# Patient Record
Sex: Female | Born: 2000 | Race: White | Hispanic: No | Marital: Single | Smoking: Never smoker
Health system: Southern US, Community
[De-identification: ages and names within clinical notes are randomized; demographics above are authoritative.]

## PROBLEM LIST (undated history)

## (undated) HISTORY — PX: EYE SURGERY: SHX253

---
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
## 2021-04-28 ENCOUNTER — Other Ambulatory Visit: Payer: Self-pay

## 2021-04-28 ENCOUNTER — Encounter (HOSPITAL_BASED_OUTPATIENT_CLINIC_OR_DEPARTMENT_OTHER): Payer: Self-pay | Admitting: Emergency Medicine

## 2021-04-28 ENCOUNTER — Emergency Department (HOSPITAL_BASED_OUTPATIENT_CLINIC_OR_DEPARTMENT_OTHER)
Admission: EM | Admit: 2021-04-28 | Discharge: 2021-04-29 | Disposition: A | Discharge status: Home / Self Care | Payer: BC Managed Care – PPO | Attending: Emergency Medicine | Admitting: Emergency Medicine

## 2021-04-28 DIAGNOSIS — K5900 Constipation, unspecified: Secondary | ICD-10-CM | POA: Insufficient documentation

## 2021-04-28 DIAGNOSIS — R102 Pelvic and perineal pain: Secondary | ICD-10-CM | POA: Diagnosis not present

## 2021-04-28 DIAGNOSIS — R339 Retention of urine, unspecified: Secondary | ICD-10-CM | POA: Diagnosis present

## 2021-04-28 DIAGNOSIS — R338 Other retention of urine: Secondary | ICD-10-CM

## 2021-04-28 LAB — URINALYSIS, ROUTINE W REFLEX MICROSCOPIC
Bilirubin Urine: NEGATIVE
Glucose, UA: NEGATIVE mg/dL
Hgb urine dipstick: NEGATIVE
Ketones, ur: NEGATIVE mg/dL
Leukocytes,Ua: NEGATIVE
Nitrite: NEGATIVE
Protein, ur: NEGATIVE mg/dL
Specific Gravity, Urine: 1.02 (ref 1.005–1.030)
pH: 7 (ref 5.0–8.0)

## 2021-04-28 NOTE — ED Provider Notes (Signed)
[facility] EMERGENCY DEPARTMENT Provider Note   CSN: [id number] Arrival date & time: 04/28/21  2054     History Chief Complaint  Patient presents with  . Urinary Retention    Michele Orozco is a 20 y.o. female with history of ADHD.  Patient presents with chief complaint of urinary retention.  Patient reports that she has not urinated since last evening.  Patient endorses she has been eating and drinking normally over this time.  Patient has urge to urinate however is unable to initiate urination when in the bathroom.  Endorses associated suprapubic abdominal pain.  Patient rates her pain an 8/10 on the pain scale.  Pain is worse with palpation.  Patient denies any alleviating factors.  Prior to her urinary retention patient denies any dysuria, hematuria, urinary frequency, urinary urgency.  Patient denies any recent falls or injuries.  Patient reports starting Keflex 3 days prior for "hair follicle infection."  Patient denies any other new medications.  Patient reports that she took her prescribed Adderall over the last few days but has been on this medication for years without incident.  Patient denies any alcohol use or illicit drug use.  Patient denies any fevers, chills, nausea, vomiting, vaginal bleeding, vaginal pain, vaginal discharge, genital sores or lesions.  Patient reports she is not sexually active.  HPI     History reviewed. No pertinent past medical history.  Patient Active Problem List   Diagnosis Date Noted  . Disordered eating 10/31/2017    Past Surgical History:  Procedure Laterality Date  . EYE SURGERY       OB History   No obstetric history on file.     Family History  Problem Relation Age of Onset  . Hypertension Other   . Hyperlipidemia Other   . Stroke Other   . Heart attack Other     Social History   Tobacco Use  . Smoking status: Never Smoker  . Smokeless tobacco: Never Used    Home Medications Prior to Admission  medications   Medication Sig Start Date End Date Taking? Authorizing Provider  calcium carbonate 1250 MG capsule Take 1,250 mg by mouth 2 (two) times daily with a meal.    [provider]  Multiple Vitamin (MULTIVITAMIN) capsule Take 1 capsule by mouth daily.    [provider]    Allergies    Patient has no known allergies.  Review of Systems   Review of Systems  Constitutional: Negative for chills and fever.  Gastrointestinal: Positive for abdominal pain. Negative for abdominal distention, anal bleeding, blood in stool, constipation, diarrhea, nausea, rectal pain and vomiting.  Genitourinary: Positive for difficulty urinating. Negative for decreased urine volume, enuresis, flank pain, frequency, genital sores, hematuria, menstrual problem, pelvic pain, urgency, vaginal bleeding, vaginal discharge and vaginal pain.    Physical Exam Updated Vital Signs BP 101/89 (BP Location: Right Arm)   Pulse 66   Temp 98 F (36.7 C) (Oral)   Resp 18   Ht 5' 2.5" (1.588 m)   Wt 49.9 kg   LMP 04/12/2021   SpO2 97%   BMI 19.80 kg/m   Physical Exam Vitals and nursing note reviewed.  Constitutional:      General: She is not in acute distress.    Appearance: She is not ill-appearing, toxic-appearing or diaphoretic.     Comments: Appears uncomfortable due to complaints of suprapubic pain  HENT:     Head: Normocephalic.  Eyes:     General: No scleral icterus.  Right eye: No discharge.        Left eye: No discharge.  Cardiovascular:     Rate and Rhythm: Normal rate.  Pulmonary:     Effort: Pulmonary effort is normal. No respiratory distress.     Breath sounds: No stridor.  Abdominal:     General: Abdomen is flat. Bowel sounds are normal. There is no distension. There are no signs of injury.     Palpations: Abdomen is soft. There is no mass or pulsatile mass.     Tenderness: There is abdominal tenderness in the suprapubic area. There is no right CVA tenderness, left  CVA tenderness, guarding or rebound.     Hernia: There is no hernia in the umbilical area or ventral area.  Skin:    General: Skin is warm and dry.  Neurological:     General: No focal deficit present.     Mental Status: She is alert.  Psychiatric:        Behavior: Behavior is cooperative.     ED Results / Procedures / Treatments   Labs (all labs ordered are listed, but only abnormal results are displayed) Labs Reviewed  URINALYSIS, ROUTINE W REFLEX MICROSCOPIC    EKG None  Radiology No results found.  Procedures Procedures   Medications Ordered in ED Medications - No data to display  ED Course  I have reviewed the triage vital signs and the nursing notes.  Pertinent labs & imaging results that were available during my care of the patient were reviewed by me and considered in my medical decision making (see chart for details).    MDM Rules/Calculators/A&P                          Alert 20 year old female no acute distress, nontoxic-appearing, patient appears uncomfortable due to complaints of suprapubic pain.  Patient presents with chief plaint of urinary retention.  Patient has not urinated since yesterday evening.  Recent change to medication regiment was patient starting Keflex for reported hair follicle infection.  Patient denies any antihistamine or decongestant use.  Denies any recent falls or injuries.  Patient denies numbness or weakness to extremities, focal neurological deficit, back pain.  On physical exam patient has strength to dorsiflexion and plantarflexion, able to lift and hold both legs against gravity without difficulty, sensation to light touch intact to bilateral lower extremities.  Abdomen soft, nondistended, tenderness to suprapubic area.  No CVA tenderness, rebound tenderness, guarding, mass, pulsatile mass.  Bladder scan shows 520 and 530 mL of urine in bladder.  In and out catheter ordered.  2233 informed by RN that 800 mL of fluid were drained  with in and out catheter.  On repeat examination patient reports improvement in her abdominal discomfort.  Abdomen soft, nondistended, without tenderness.  Urinalysis obtained shows no signs of infection, urinalysis is unremarkable.  On examination of patient's scalp patient has no signs of infection, folliculitis, or cellulitis.  Will discontinue patient's Keflex.  Will discharge patient to follow-up with primary care provider.  Patient given strict return precautions to return to the emergency department if urinary retention does not resolve.  Patient and patient's mother at bedside expressed understanding of all instructions and are agreeable with this plan.  Final Clinical Impression(s) / ED Diagnoses Final diagnoses:  Acute urinary retention    Rx / DC Orders ED Discharge Orders    None       Haskel Schroeder, PA-C 04/29/21 2831  Tilden Fossa, MD 04/29/21 2013

## 2021-04-28 NOTE — ED Triage Notes (Signed)
Reports unable to urinate since last night.  Taking keflex for a hair follicle infection.  Denies any vaginal discharge.

## 2021-04-28 NOTE — Discharge Instructions (Addendum)
You came to the emergency department today to be evaluated for your urinary retention.  You had your bladder drained.  Please stop taking your Keflex medication as this may have caused your urinary retention.  Please avoid taking antihistamines or decongestions as this can cause symptoms of urinary retention.  If your symptoms return tomorrow please return to the emergency department.  Get help right away if: You have chills or a fever. You have blood in your urine.

## 2021-04-28 NOTE — ED Notes (Addendum)
Bladder scan: - Scanned twice.

## 2021-04-28 NOTE — ED Notes (Signed)
Last time urinated last night before bed. C/O pressure lower Abd/ bladder are.  States drinking normally.  Eating normally.

## 2021-04-28 NOTE — ED Notes (Signed)
ED Provider at bedside. 

## 2021-04-29 ENCOUNTER — Emergency Department (HOSPITAL_BASED_OUTPATIENT_CLINIC_OR_DEPARTMENT_OTHER)
Admission: EM | Admit: 2021-04-29 | Discharge: 2021-04-29 | Disposition: A | Discharge status: Home / Self Care | Payer: BC Managed Care – PPO | Source: Home / Self Care | Attending: Emergency Medicine | Admitting: Emergency Medicine

## 2021-04-29 ENCOUNTER — Emergency Department (HOSPITAL_BASED_OUTPATIENT_CLINIC_OR_DEPARTMENT_OTHER): Payer: BC Managed Care – PPO

## 2021-04-29 DIAGNOSIS — R339 Retention of urine, unspecified: Secondary | ICD-10-CM | POA: Insufficient documentation

## 2021-04-29 DIAGNOSIS — K5900 Constipation, unspecified: Secondary | ICD-10-CM | POA: Insufficient documentation

## 2021-04-29 LAB — PREGNANCY, URINE: Preg Test, Ur: NEGATIVE

## 2021-04-29 MED ORDER — IOHEXOL 300 MG/ML  SOLN
75.0000 mL | Freq: Once | INTRAMUSCULAR | Status: AC
  Administered 2021-04-29: 75 mL via INTRAVENOUS

## 2021-04-29 MED ORDER — IBUPROFEN 400 MG PO TABS
400.0000 mg | ORAL_TABLET | Freq: Once | ORAL | Status: AC
  Administered 2021-04-29: 400 mg via ORAL
  Filled 2021-04-29: qty 1

## 2021-04-29 NOTE — ED Notes (Addendum)
Pt back from CT - water given to Pt and mother - OK per MD [facility] @ BS getting VS

## 2021-04-29 NOTE — ED Notes (Signed)
Urine sent to lab  @11 :15 went to lab to cancel GC/Chlam. - urine still in drop box 11.27

## 2021-04-29 NOTE — ED Provider Notes (Signed)
[facility] EMERGENCY DEPARTMENT Provider Note   CSN: [id number] Arrival date & time: 04/29/21  0857     History Chief Complaint  Patient presents with  . Urinary Retention    [name] is a 20 y.o. female.  Patient is a 20 year old female returning to the emergency room today for ongoing urinary retention.  Patient symptoms have now been present for 48 hours.  She was seen yesterday in the emergency room and had 800 mL drained by an in and out cath.  There is no evidence of infection on the UA.  Patient denies any dysuria.  Currently she has 4 out of 10 abdominal pain in the suprapubic region.  She just has urgency but nothing will come out.  She has not noticed any blood in her urine.  She does have a copper IUD that was placed over a year ago but has not had any complications with this.  She is not currently having any vaginal discharge or bleeding.  She has never been sexually active.  Patient denies taking any over-the-counter antihistamines.  She had been on Keflex for a scalp infection and that was discontinued yesterday.  Since going home she has felt fine but has still not been able to urinate and came back today because she was starting to have more pressure in her bladder area.  The history is provided by the patient.       No past medical history on file.  Patient Active Problem List   Diagnosis Date Noted  . Disordered eating 10/31/2017    Past Surgical History:  Procedure Laterality Date  . EYE SURGERY       OB History   No obstetric history on file.     Family History  Problem Relation Age of Onset  . Hypertension Other   . Hyperlipidemia Other   . Stroke Other   . Heart attack Other     Social History   Tobacco Use  . Smoking status: Never Smoker  . Smokeless tobacco: Never Used    Home Medications Prior to Admission medications   Medication Sig Start Date End Date Taking? Authorizing Provider  calcium carbonate 1250 MG  capsule Take 1,250 mg by mouth 2 (two) times daily with a meal.    [provider]  Multiple Vitamin (MULTIVITAMIN) capsule Take 1 capsule by mouth daily.    [provider]  paragard intrauterine copper IUD IUD by Intrauterine route.    [provider]    Allergies    Sulfa antibiotics  Review of Systems   Review of Systems  All other systems reviewed and are negative.   Physical Exam Updated Vital Signs BP 111/77 (BP Location: Right Arm)   Pulse 74   Temp 98.5 F (36.9 C) (Oral)   Resp 14   Ht 5' 2.5" (1.588 m)   Wt 49.9 kg   LMP 04/12/2021   SpO2 100%   BMI 19.80 kg/m   Physical Exam Vitals and nursing note reviewed. Exam conducted with a chaperone present.  Constitutional:      General: She is not in acute distress.    Appearance: Normal appearance. She is well-developed and normal weight.  HENT:     Head: Normocephalic and atraumatic.  Eyes:     Pupils: Pupils are equal, round, and reactive to light.  Cardiovascular:     Rate and Rhythm: Normal rate and regular rhythm.     Heart sounds: Normal heart sounds. No murmur heard. No  friction rub.  Pulmonary:     Effort: Pulmonary effort is normal.     Breath sounds: Normal breath sounds. No wheezing or rales.  Abdominal:     General: Bowel sounds are normal. There is no distension.     Palpations: Abdomen is soft.     Tenderness: There is abdominal tenderness in the suprapubic area. There is no guarding or rebound.  Genitourinary:    General: Normal vulva.     Labia:        Right: No rash, tenderness or lesion.        Left: No rash, tenderness or lesion.      Vagina: Normal.     Cervix: Normal.     Uterus: Normal.      Adnexa: Right adnexa normal and left adnexa normal.     Comments: Strings of the IUD are present at the cervical os.  No bleeding or discharge present Musculoskeletal:        General: No tenderness. Normal range of motion.     Comments: No edema  Skin:    General:  Skin is warm and dry.     Findings: No rash.  Neurological:     Mental Status: She is alert and oriented to person, place, and time. Mental status is at baseline.     Cranial Nerves: No cranial nerve deficit.     Sensory: No sensory deficit.     Motor: No weakness.     Gait: Gait normal.  Psychiatric:        Mood and Affect: Mood normal.        Behavior: Behavior normal.     ED Results / Procedures / Treatments   Labs (all labs ordered are listed, but only abnormal results are displayed) Labs Reviewed  WET PREP, GENITAL  GC/CHLAMYDIA PROBE AMP ([place]) NOT AT [facility]    EKG None  Radiology CT ABDOMEN PELVIS W CONTRAST  Result Date: 04/29/2021 CLINICAL DATA:  Urinary retention. EXAM: CT ABDOMEN AND PELVIS WITH CONTRAST TECHNIQUE: Multidetector CT imaging of the abdomen and pelvis was performed using the standard protocol following bolus administration of intravenous contrast. CONTRAST:  73mL OMNIPAQUE IOHEXOL 300 MG/ML  SOLN COMPARISON:  None. FINDINGS: Lower chest: Clear lung bases.  Heart normal in size. Hepatobiliary: No focal liver abnormality is seen. No gallstones, gallbladder wall thickening, or biliary dilatation. Pancreas: Unremarkable. No pancreatic ductal dilatation or surrounding inflammatory changes. Spleen: Normal in size without focal abnormality. Adrenals/Urinary Tract: No adrenal masses. Kidneys normal in size, orientation and position with symmetric enhancement. No convincing stone, no mass and no hydronephrosis. Normal ureters. Bladder is distended. There is a well-positioned Foley catheter as well as a small amount of non dependent air. No bladder wall thickening. No mass or stone. Stomach/Bowel: Stomach unremarkable. Small bowel is normal in caliber. No wall thickening. No inflammation. Moderate increased colonic stool burden. No colon wall thickening or adjacent inflammation. Normal appendix visualized. Vascular/Lymphatic: No significant vascular findings are  present. No enlarged abdominal or pelvic lymph nodes. Reproductive: Uterus normal in size. Well-positioned IUD. No ovarian/adnexal masses. Other: Trace, physiologic pelvic free fluid. No abdominal wall hernia. Musculoskeletal: No acute or significant osseous findings. IMPRESSION: 1. Distended bladder despite the presence of a well-positioned Foley catheter. No bladder mass, wall thickening or adjacent inflammation. No stones. 2. Moderate increase in the colonic stool burden. No bowel obstruction or inflammation. 3. No other abnormalities. Electronically Signed   By: Amie Portland M.D.   On: 04/29/2021 12:20  Procedures Procedures   Medications Ordered in ED Medications - No data to display  ED Course  I have reviewed the triage vital signs and the nursing notes.  Pertinent labs & imaging results that were available during my care of the patient were reviewed by me and considered in my medical decision making (see chart for details).    MDM Rules/Calculators/A&P                          Patient presenting with recurrent urinary retention.  Unclear the cause of the urinary retention as patient has no evidence of infection by normal UA yesterday.  She is not taking any medications that would cause retention.  Patient has been very stressed recently due to exams but short of that no other new events.  Patient was seen yesterday and had 800 mL drained from her bladder but since that time she has not been able to urinate.  Bladder scan here showed 450 mL but yesterday her scan showed 500 mL and 800 was drained.  Pelvic exam without acute findings.  IUD strings are present.  Given ongoing urinary tension without specific cause we will do a scan to rule out any structural abnormality.  Foley catheter will also be placed.  12:33 PM Pt has drained of uirne.  UPT neg.  CT with moderate stool burden but no other abnormalities.  During CT patient still had a distended bladder but after getting back  from CT she drained a significant portion and feel that this is now resolved.  The catheter is in appropriate location.  We will send patient home with a leg bag.  Urology follow-up within the next week.  Also she will take MiraLAX for the next 4 to 5 days in case this is the cause of her retention.  MDM Number of Diagnoses or Management Options   Amount and/or Complexity of Data Reviewed Tests in the radiology section of CPT: reviewed and ordered Independent visualization of images, tracings, or specimens: yes     Final Clinical Impression(s) / ED Diagnoses Final diagnoses:  Urinary retention  Constipation, unspecified constipation type    Rx / DC Orders ED Discharge Orders    None       Gwyneth Sprout, MD 04/29/21 1236

## 2021-04-29 NOTE — ED Notes (Signed)
Pt to CT via WC

## 2021-04-29 NOTE — ED Notes (Signed)
489 ml bladder scan

## 2021-04-29 NOTE — ED Notes (Signed)
Urine bag switched to leg bag  Extensive teaching with pt and mother r/t foley care -  Pt sent home with urine bag, leg strap and device to measure urine output.

## 2021-04-29 NOTE — Discharge Instructions (Addendum)
The CAT scan showed that you had quite a bit of stool in your intestine.  Start taking 1 scoop of MiraLAX in 8 ounces of liquid every morning.  If you are not having large bowel movements increase to 2 scoops.  Follow-up with urology to have the catheter removed.  If for some reason they cannot give you a reasonable follow-up return to the emergency room for catheter removal. Take 1 ibuprofen 2 times a day for the next 5 days

## 2021-04-29 NOTE — ED Notes (Signed)
CT called - pt ready - tests back

## 2021-04-29 NOTE — ED Triage Notes (Signed)
Last urination was last night at this ED

## 2021-04-30 LAB — GC/CHLAMYDIA PROBE AMP (~~LOC~~) NOT AT ARMC
Chlamydia: NEGATIVE
Comment: NEGATIVE
Comment: NORMAL
Neisseria Gonorrhea: NEGATIVE

## 2021-09-28 IMAGING — CT CT ABD-PELV W/ CM
2 of 4 series · 16 of 46 positions shown, 18 images · IV contrast (Omnipaque)
Comparison: None.

CLINICAL DATA: Urinary retention.

EXAM:
CT ABDOMEN AND PELVIS WITH CONTRAST
TECHNIQUE: Multidetector CT imaging of the abdomen and pelvis was performed
using the standard protocol following bolus administration of
intravenous contrast.
CONTRAST:  75mL OMNIPAQUE IOHEXOL 300 MG/ML  SOLN

[Series 2: axial st · axial · 0.66mm/px · z∈[+704,+1099]mm · 13 of 87 slices shown, 15 images]
[im 4/87  soft-tissue]
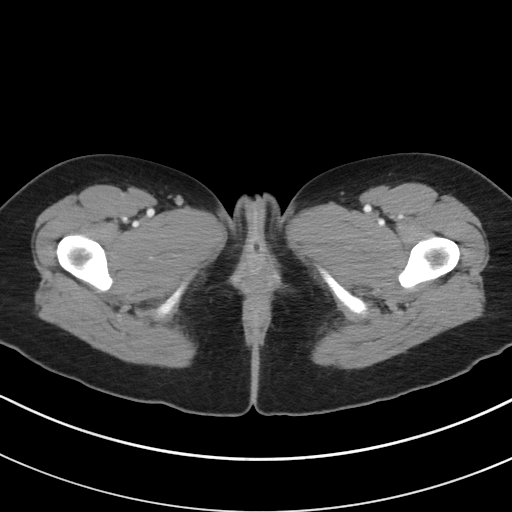
[im 4/87  bone]
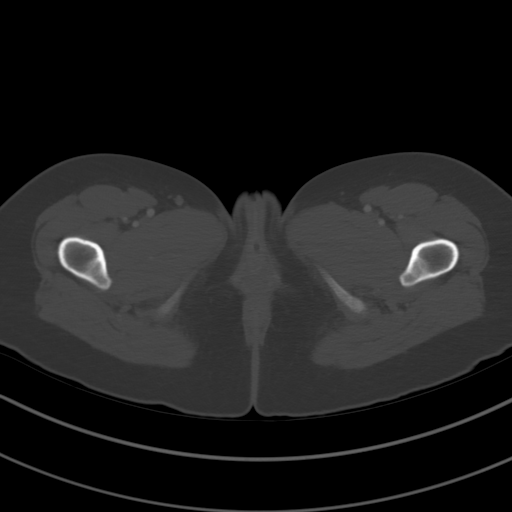
[im 11/87  soft-tissue]
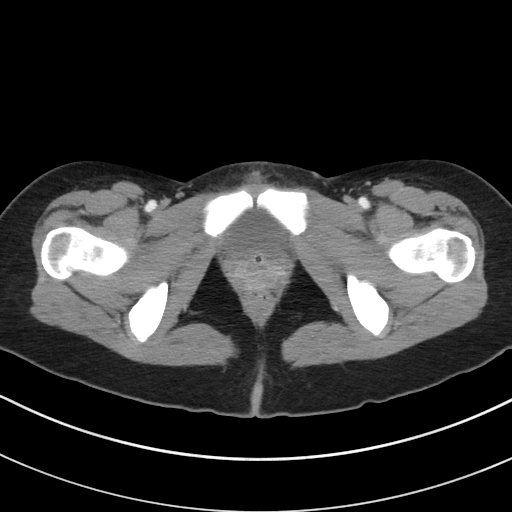
[im 18/87  soft-tissue]
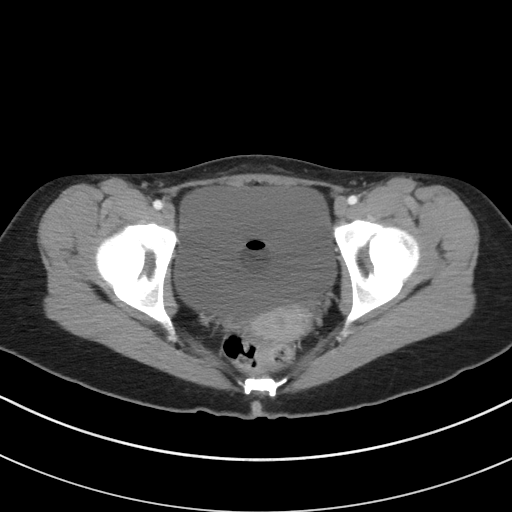
[im 25/87  soft-tissue]
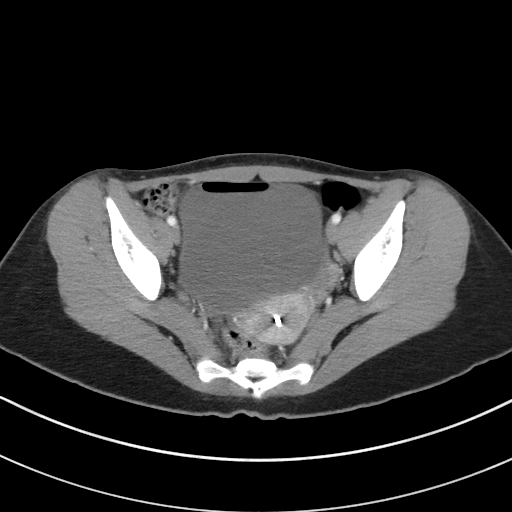
[im 31/87  soft-tissue]
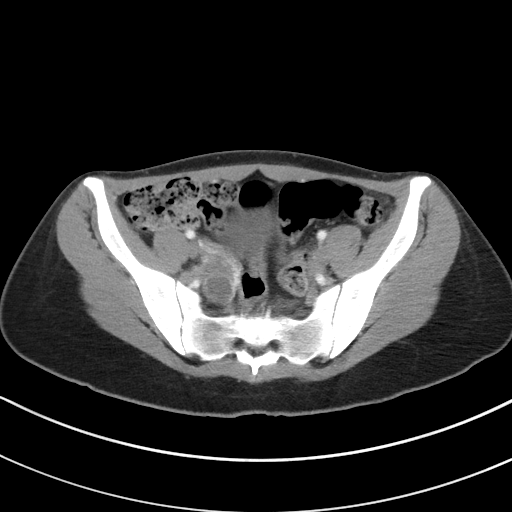
[im 38/87  soft-tissue]
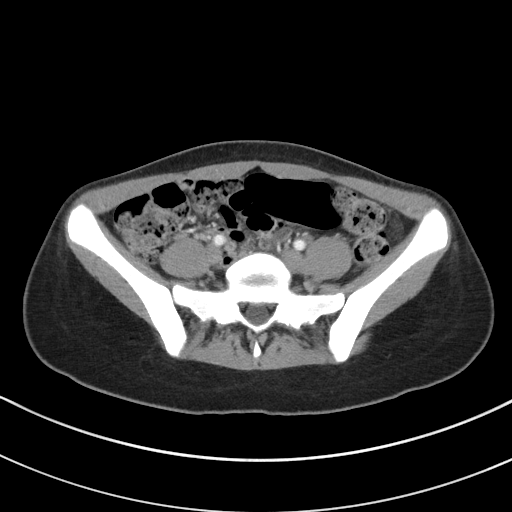
[im 45/87  soft-tissue]
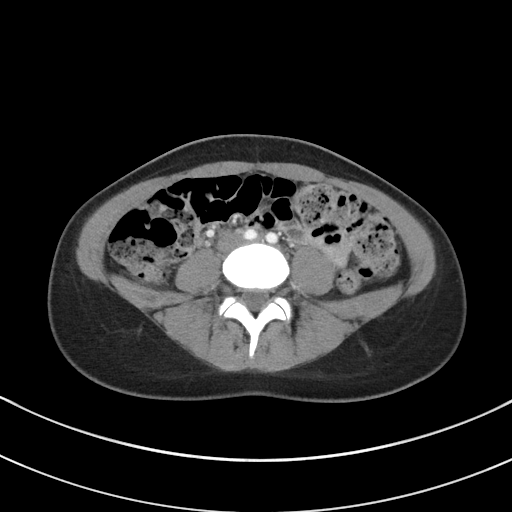
[im 49/87  soft-tissue]
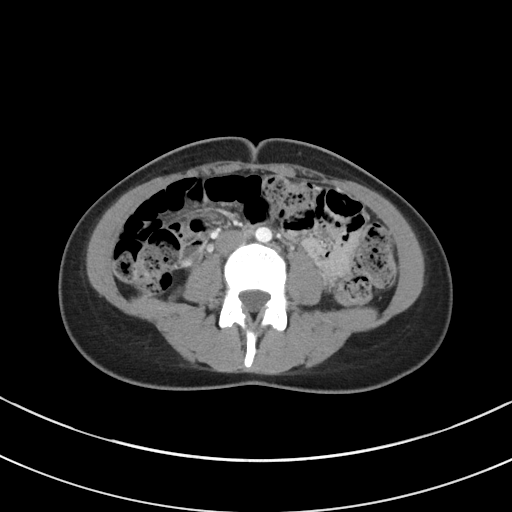
[im 56/87  soft-tissue]
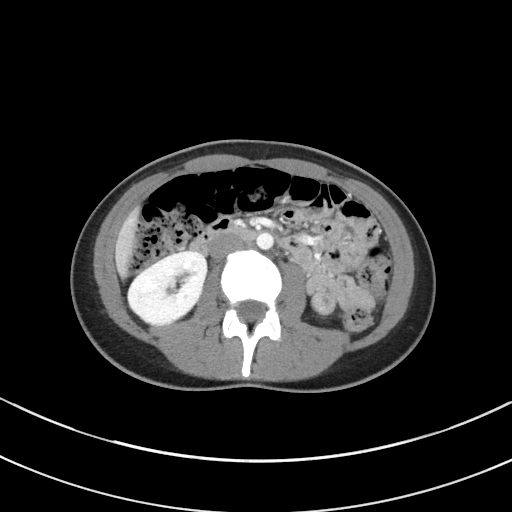
[im 56/87  bone]
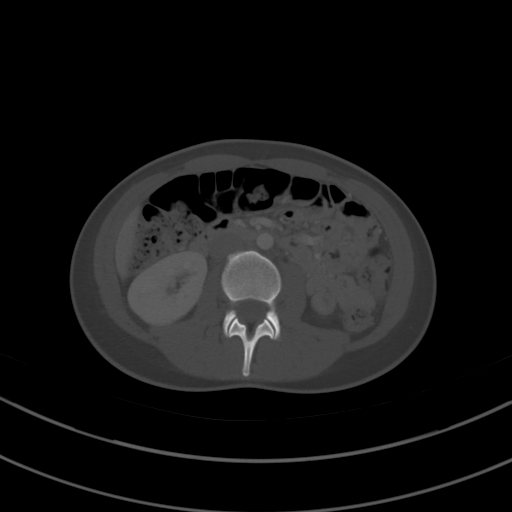
[im 62/87  soft-tissue]
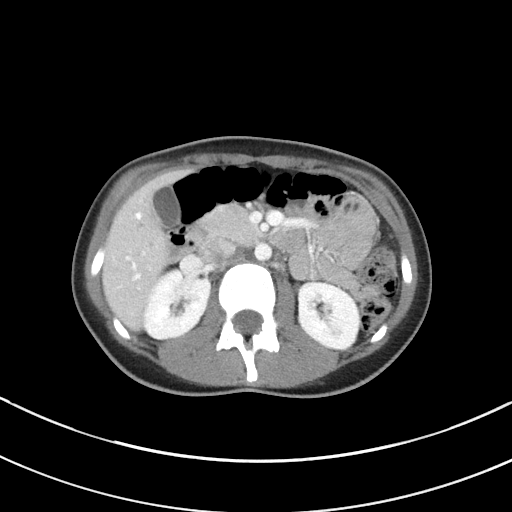
[im 69/87  soft-tissue]
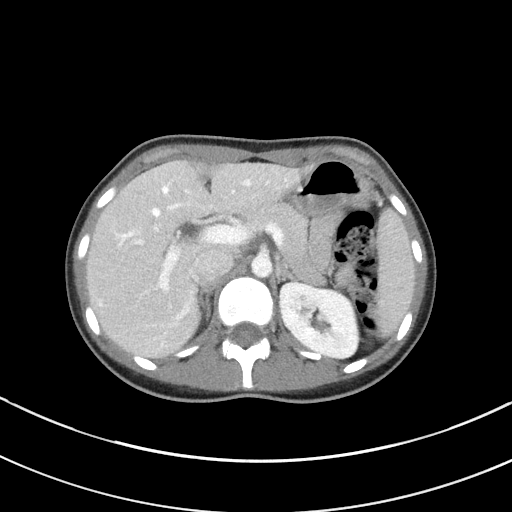
[im 76/87  soft-tissue]
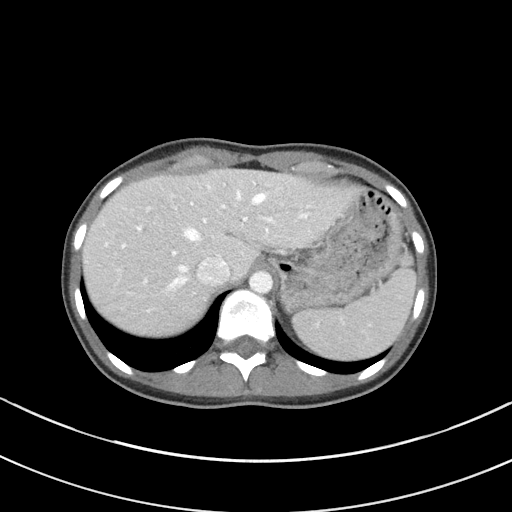
[im 83/87  soft-tissue]
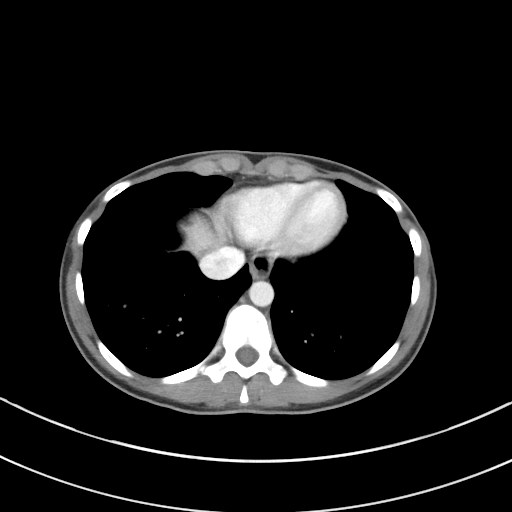

[Series 5: coronal st · coronal · 0.82mm/px · 3 of 66 slices shown]
[im 22/66  soft-tissue]
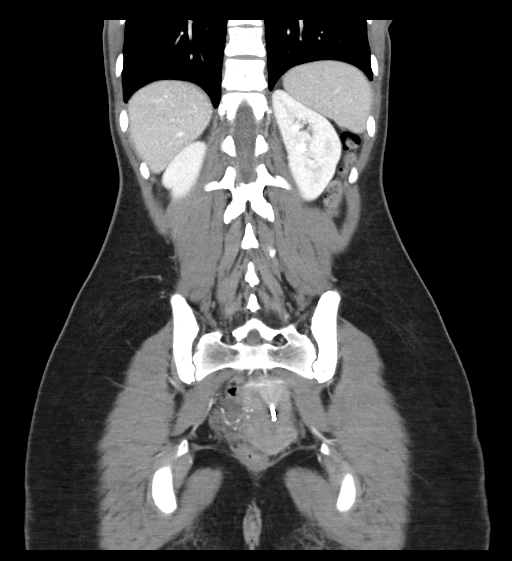
[im 29/66  soft-tissue]
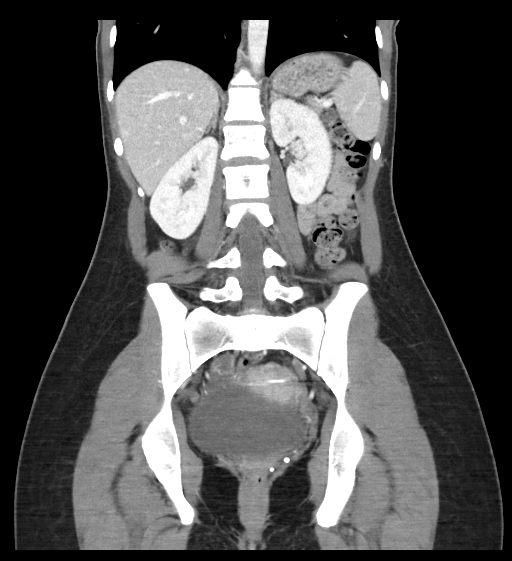
[im 37/66  soft-tissue]
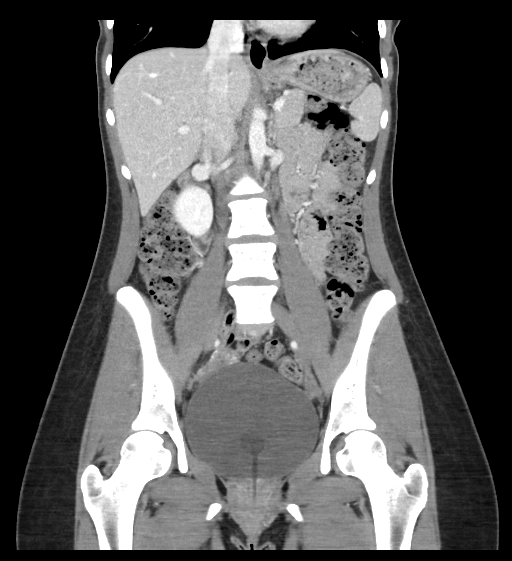

[16 of 46 positions shown; findings below may reference images not displayed]

FINDINGS: Lower chest: Clear lung bases.  Heart normal in size.

Hepatobiliary: No focal liver abnormality is seen. No gallstones,
gallbladder wall thickening, or biliary dilatation.

Pancreas: Unremarkable. No pancreatic ductal dilatation or
surrounding inflammatory changes.

Spleen: Normal in size without focal abnormality.

Adrenals/Urinary Tract: No adrenal masses. Kidneys normal in size,
orientation and position with symmetric enhancement. No convincing
stone, no mass and no hydronephrosis. Normal ureters.

Bladder is distended. There is a well-positioned Foley catheter as
well as a small amount of non dependent air. No bladder wall
thickening. No mass or stone.

Stomach/Bowel: Stomach unremarkable. Small bowel is normal in
caliber. No wall thickening. No inflammation. Moderate increased
colonic stool burden. No colon wall thickening or adjacent
inflammation. Normal appendix visualized.

Vascular/Lymphatic: No significant vascular findings are present. No
enlarged abdominal or pelvic lymph nodes.

Reproductive: Uterus normal in size. Well-positioned IUD. No
ovarian/adnexal masses.

Other: Trace, physiologic pelvic free fluid. No abdominal wall
hernia.

Musculoskeletal: No acute or significant osseous findings.
IMPRESSION: 1. Distended bladder despite the presence of a well-positioned Foley
catheter. No bladder mass, wall thickening or adjacent inflammation.
No stones.
2. Moderate increase in the colonic stool burden. No bowel
obstruction or inflammation.
3. No other abnormalities.
